# Patient Record
Sex: Female | Born: 1985 | Race: White | Hispanic: No | Marital: Single | Smoking: Never smoker
Health system: Southern US, Community
[De-identification: ages and names within clinical notes are randomized; demographics above are authoritative.]

## PROBLEM LIST (undated history)

## (undated) DIAGNOSIS — G43909 Migraine, unspecified, not intractable, without status migrainosus: Secondary | ICD-10-CM

## (undated) HISTORY — PX: FOOT SURGERY: SHX648

---
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
## 2004-06-16 ENCOUNTER — Ambulatory Visit: Payer: Self-pay | Admitting: Family Medicine

## 2004-06-16 ENCOUNTER — Inpatient Hospital Stay (HOSPITAL_COMMUNITY): Admission: AD | Admit: 2004-06-16 | Discharge: 2004-06-17 | Payer: Self-pay | Admitting: Obstetrics and Gynecology

## 2004-06-19 ENCOUNTER — Inpatient Hospital Stay (HOSPITAL_COMMUNITY): Admission: AD | Admit: 2004-06-19 | Discharge: 2004-06-19 | Payer: Self-pay | Admitting: *Deleted

## 2012-10-18 ENCOUNTER — Encounter (HOSPITAL_COMMUNITY): Payer: Self-pay | Admitting: Emergency Medicine

## 2012-10-18 ENCOUNTER — Emergency Department (HOSPITAL_COMMUNITY)
Admission: EM | Admit: 2012-10-18 | Discharge: 2012-10-18 | Disposition: A | Discharge status: Home / Self Care | Payer: Managed Care, Other (non HMO) | Attending: Emergency Medicine | Admitting: Emergency Medicine

## 2012-10-18 DIAGNOSIS — H9209 Otalgia, unspecified ear: Secondary | ICD-10-CM | POA: Insufficient documentation

## 2012-10-18 DIAGNOSIS — F17200 Nicotine dependence, unspecified, uncomplicated: Secondary | ICD-10-CM | POA: Insufficient documentation

## 2012-10-18 DIAGNOSIS — H53149 Visual discomfort, unspecified: Secondary | ICD-10-CM | POA: Insufficient documentation

## 2012-10-18 DIAGNOSIS — R51 Headache: Secondary | ICD-10-CM | POA: Insufficient documentation

## 2012-10-18 DIAGNOSIS — R42 Dizziness and giddiness: Secondary | ICD-10-CM | POA: Insufficient documentation

## 2012-10-18 DIAGNOSIS — Z8679 Personal history of other diseases of the circulatory system: Secondary | ICD-10-CM | POA: Insufficient documentation

## 2012-10-18 HISTORY — DX: Migraine, unspecified, not intractable, without status migrainosus: G43.909

## 2012-10-18 MED ORDER — METOCLOPRAMIDE HCL 5 MG/ML IJ SOLN
10.0000 mg | Freq: Once | INTRAMUSCULAR | Status: AC
  Administered 2012-10-18: 10 mg via INTRAVENOUS
  Filled 2012-10-18: qty 2

## 2012-10-18 MED ORDER — MORPHINE SULFATE 4 MG/ML IJ SOLN
2.0000 mg | Freq: Once | INTRAMUSCULAR | Status: AC
  Administered 2012-10-18: 2 mg via INTRAVENOUS
  Filled 2012-10-18: qty 1

## 2012-10-18 MED ORDER — SODIUM CHLORIDE 0.9 % IV SOLN
Freq: Once | INTRAVENOUS | Status: AC
  Administered 2012-10-18: 13:00:00 via INTRAVENOUS

## 2012-10-18 NOTE — ED Notes (Signed)
Dizzy also she states has bad cough

## 2012-10-18 NOTE — ED Notes (Signed)
Pt sts medication makes her head feel "weird and throbbing"

## 2012-10-18 NOTE — ED Notes (Signed)
Pt c/o headache that started this morning. Pt rates headache 10/10. Pt states she usually takes goody powders for headaches but it only gives her relief for a short period of time. Pt also has cough and dizziness that started on Sunday. Pt states she had a sore throat on Saturday while on vacation and has since gotten worse.

## 2012-10-18 NOTE — ED Notes (Addendum)
Pt states she just [place] a [place]. Pt states her headaches have been getting more frequent.

## 2012-10-18 NOTE — ED Notes (Signed)
Jen PA-C updated on pt condition. Pt denies pain. sts feels better.
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
## 2012-10-18 NOTE — ED Provider Notes (Signed)
History     CSN: [id number]  Arrival date & time 10/18/12  1154   First MD Initiated Contact with Patient 10/18/12 1226      Chief Complaint  Patient presents with  . Headache    (Consider location/radiation/quality/duration/timing/severity/associated sxs/prior treatment) HPI Comments: Pt is a 27 yo F PMHx significant for headaches presenting to the ED for a sudden onset headache that began this morning when she woke up. Pt states pain began in the back of her head and migrated to front of head, describing pain as constant and throbbing w/o radiation. Pt has associated photophobia and dizziness. Pt states this is a typical presentation w/ associated symptoms for her headaches in the past. Pt denies any alleviating factors, but states she did not try her at home Goody Headache powder today. Aggravating factors include light. Pt states she typically has two headaches a day for several years, but has not been evaluated by a neurologist for her headaches. Denies numbness or weakness in extremities, syncope, LOC, vomiting, difficulty ambulating, fever, chills, neck pain, slurred speech. Friend verifies there is no changes in personality, behavior, speech.  Patient is a 27 y.o. female presenting with headaches.  Headache Associated symptoms: dizziness, ear pain and photophobia   Associated symptoms: no abdominal pain, no fever, no nausea, no numbness and no vomiting     Past Medical History  Diagnosis Date  . Migraine     No past surgical history on file.  No family history on file.  History  Substance Use Topics  . Smoking status: Current Every Day Smoker  . Smokeless tobacco: Not on file  . Alcohol Use: Yes    OB History   Grav Para Term Preterm Abortions TAB SAB Ect Mult Living                  Review of Systems  Constitutional: Negative for fever and chills.  HENT: Positive for ear pain.   Eyes: Positive for photophobia.  Respiratory: Negative for shortness of breath.    Cardiovascular: Negative for chest pain.  Gastrointestinal: Negative for nausea, vomiting and abdominal pain.  Genitourinary: Negative.   Musculoskeletal: Negative.   Skin: Negative.   Neurological: Positive for dizziness and headaches. Negative for weakness and numbness.    Allergies  Review of patient's allergies indicates no known allergies.  Home Medications   Current Outpatient Rx  Name  Route  Sig  Dispense  Refill  . Aspirin-Acetaminophen-Caffeine (GOODY HEADACHE PO)   Oral   Take 1 Package by mouth once as needed (headache).           BP 100/54  Pulse 67  Temp(Src) 98 F (36.7 C) (Oral)  Resp 16  SpO2 100%  Physical Exam  Constitutional: She is oriented to person, place, and time. She appears well-developed and well-nourished.  HENT:  Head: Normocephalic and atraumatic.  Eyes: EOM are normal. Pupils are equal, round, and reactive to light.  Cardiovascular: Normal rate, regular rhythm and normal heart sounds.   Pulmonary/Chest: Effort normal and breath sounds normal.  Abdominal: Soft. Bowel sounds are normal.  Neurological: She is alert and oriented to person, place, and time. She has normal strength. No cranial nerve deficit or sensory deficit. Coordination normal.  No pronator drift. Intact bilateral finger nose finger Intact bilateral heel knee to shin No meningism signs.   Skin: Skin is warm and dry.  Psychiatric: She has a normal mood and affect.    ED Course  Procedures (including critical care  time)  Medications  0.9 %  sodium chloride infusion ( Intravenous Stopped 10/18/12 1434)  metoCLOPramide (REGLAN) injection 10 mg (10 mg Intravenous Given 10/18/12 1330)  morphine 4 MG/ML injection 2 mg (2 mg Intravenous Given 10/18/12 1330)   Pain is resolved after round of pain medication.  Labs Reviewed - No data to display No results found.   1. Headache       MDM  Pt HA treated and improved while in ED.  Presentation is like pts typical HA and  non concerning for [facility], ICH, Meningitis, or temporal arteritis. Pt is afebrile with no focal neuro deficits, nuchal rigidity, or change in vision. Pt is to follow up with PCP and neurology to discuss prophylactic medication and recurrent headaches. Pt verbalizes understanding and is agreeable with plan to dc. Patient d/w with Dr. Rhunette Croft, agrees with plan. Patient is stable at time of discharge           BREHANNA DEVENY, PA-C 10/18/12 1944

## 2012-10-19 NOTE — ED Provider Notes (Signed)
Medical screening examination/treatment/procedure(s) were performed by non-physician practitioner and as supervising physician I was immediately available for consultation/collaboration.  Derwood Kaplan, MD 10/19/12 [phone]

## 2013-08-19 ENCOUNTER — Emergency Department (HOSPITAL_COMMUNITY)
Admission: EM | Admit: 2013-08-19 | Discharge: 2013-08-19 | Disposition: A | Discharge status: Home / Self Care | Payer: No Typology Code available for payment source | Attending: Emergency Medicine | Admitting: Emergency Medicine

## 2013-08-19 ENCOUNTER — Emergency Department (HOSPITAL_COMMUNITY): Payer: No Typology Code available for payment source

## 2013-08-19 ENCOUNTER — Encounter (HOSPITAL_COMMUNITY): Payer: Self-pay | Admitting: Emergency Medicine

## 2013-08-19 DIAGNOSIS — S298XXA Other specified injuries of thorax, initial encounter: Secondary | ICD-10-CM | POA: Insufficient documentation

## 2013-08-19 DIAGNOSIS — Y92410 Unspecified street and highway as the place of occurrence of the external cause: Secondary | ICD-10-CM | POA: Insufficient documentation

## 2013-08-19 DIAGNOSIS — Z3202 Encounter for pregnancy test, result negative: Secondary | ICD-10-CM | POA: Insufficient documentation

## 2013-08-19 DIAGNOSIS — S40019A Contusion of unspecified shoulder, initial encounter: Secondary | ICD-10-CM | POA: Insufficient documentation

## 2013-08-19 DIAGNOSIS — F17200 Nicotine dependence, unspecified, uncomplicated: Secondary | ICD-10-CM | POA: Insufficient documentation

## 2013-08-19 DIAGNOSIS — Y9389 Activity, other specified: Secondary | ICD-10-CM | POA: Insufficient documentation

## 2013-08-19 DIAGNOSIS — Z8679 Personal history of other diseases of the circulatory system: Secondary | ICD-10-CM | POA: Insufficient documentation

## 2013-08-19 DIAGNOSIS — S40011A Contusion of right shoulder, initial encounter: Secondary | ICD-10-CM

## 2013-08-19 LAB — POC URINE PREG, ED: Preg Test, Ur: NEGATIVE

## 2013-08-19 MED ORDER — IOHEXOL 300 MG/ML  SOLN
80.0000 mL | Freq: Once | INTRAMUSCULAR | Status: AC | PRN
  Administered 2013-08-19: 80 mL via INTRAVENOUS

## 2013-08-19 MED ORDER — SODIUM CHLORIDE 0.9 % IV BOLUS (SEPSIS)
1000.0000 mL | Freq: Once | INTRAVENOUS | Status: AC
  Administered 2013-08-19: 1000 mL via INTRAVENOUS
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
## 2013-08-19 NOTE — ED Notes (Signed)
Patient is resting comfortably. 

## 2013-08-19 NOTE — ED Notes (Signed)
Patient transported to CT 
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
## 2014-11-13 ENCOUNTER — Ambulatory Visit (INDEPENDENT_AMBULATORY_CARE_PROVIDER_SITE_OTHER): Payer: Self-pay | Admitting: Emergency Medicine

## 2014-11-13 VITALS — BP 102/70 | HR 116 | Temp 98.3°F | Resp 20 | Ht 68.0 in | Wt 229.0 lb

## 2014-11-13 DIAGNOSIS — L237 Allergic contact dermatitis due to plants, except food: Secondary | ICD-10-CM

## 2014-11-13 MED ORDER — TRIAMCINOLONE ACETONIDE 0.1 % EX CREA: 1.0000 "application " | TOPICAL_CREAM | Freq: Two times a day (BID) | CUTANEOUS | Status: AC

## 2014-11-13 NOTE — Patient Instructions (Signed)
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
## 2023-01-21 ENCOUNTER — Encounter (HOSPITAL_BASED_OUTPATIENT_CLINIC_OR_DEPARTMENT_OTHER): Payer: Self-pay | Admitting: Emergency Medicine

## 2023-01-21 ENCOUNTER — Other Ambulatory Visit: Payer: Self-pay

## 2023-01-21 ENCOUNTER — Other Ambulatory Visit (HOSPITAL_BASED_OUTPATIENT_CLINIC_OR_DEPARTMENT_OTHER): Payer: Self-pay

## 2023-01-21 ENCOUNTER — Emergency Department (HOSPITAL_BASED_OUTPATIENT_CLINIC_OR_DEPARTMENT_OTHER)
Admission: EM | Admit: 2023-01-21 | Discharge: 2023-01-21 | Disposition: A | Discharge status: Home / Self Care | Payer: BLUE CROSS/BLUE SHIELD | Attending: Emergency Medicine | Admitting: Emergency Medicine

## 2023-01-21 DIAGNOSIS — T24211A Burn of second degree of right thigh, initial encounter: Secondary | ICD-10-CM | POA: Diagnosis present

## 2023-01-21 DIAGNOSIS — Z23 Encounter for immunization: Secondary | ICD-10-CM | POA: Insufficient documentation

## 2023-01-21 DIAGNOSIS — X100XXA Contact with hot drinks, initial encounter: Secondary | ICD-10-CM | POA: Diagnosis not present

## 2023-01-21 MED ORDER — ONDANSETRON HCL 4 MG/2ML IJ SOLN
4.0000 mg | Freq: Once | INTRAMUSCULAR | Status: AC
  Administered 2023-01-21: 4 mg via INTRAVENOUS
  Filled 2023-01-21: qty 2

## 2023-01-21 MED ORDER — HYDROMORPHONE HCL 1 MG/ML IJ SOLN
1.0000 mg | Freq: Once | INTRAMUSCULAR | Status: AC
  Administered 2023-01-21: 1 mg via INTRAVENOUS
  Filled 2023-01-21: qty 1

## 2023-01-21 MED ORDER — TETANUS-DIPHTH-ACELL PERTUSSIS 5-2.5-18.5 LF-MCG/0.5 IM SUSY
0.5000 mL | PREFILLED_SYRINGE | Freq: Once | INTRAMUSCULAR | Status: AC
  Administered 2023-01-21: 0.5 mL via INTRAMUSCULAR
  Filled 2023-01-21: qty 0.5

## 2023-01-21 MED ORDER — OXYCODONE-ACETAMINOPHEN 5-325 MG PO TABS
1.0000 | ORAL_TABLET | Freq: Four times a day (QID) | ORAL | 0 refills | Status: AC | PRN
  Filled 2023-01-21: qty 15, 4d supply, fill #0
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
## 2023-01-21 NOTE — ED Notes (Signed)
Pain meds, dressing   Darral Dash, DO 01/21/23 [phone]
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
## 2023-02-01 ENCOUNTER — Other Ambulatory Visit (HOSPITAL_BASED_OUTPATIENT_CLINIC_OR_DEPARTMENT_OTHER): Payer: Self-pay
